# Patient Record
Sex: Female | Born: 1970 | Race: White | Hispanic: No | Marital: Married
Health system: Southern US, Community
[De-identification: ages and names within clinical notes are randomized; demographics above are authoritative.]

---

## 1998-03-15 ENCOUNTER — Other Ambulatory Visit: Admission: RE | Admit: 1998-03-15 | Discharge: 1998-03-15 | Payer: Self-pay | Admitting: Obstetrics and Gynecology

## 1999-09-14 ENCOUNTER — Inpatient Hospital Stay (HOSPITAL_COMMUNITY): Admission: AD | Admit: 1999-09-14 | Discharge: 1999-09-17 | Payer: Self-pay | Admitting: Obstetrics and Gynecology

## 1999-10-16 ENCOUNTER — Other Ambulatory Visit: Admission: RE | Admit: 1999-10-16 | Discharge: 1999-10-16 | Payer: Self-pay | Admitting: Obstetrics and Gynecology

## 2000-10-25 ENCOUNTER — Other Ambulatory Visit: Admission: RE | Admit: 2000-10-25 | Discharge: 2000-10-25 | Payer: Self-pay | Admitting: Obstetrics and Gynecology

## 2002-02-16 ENCOUNTER — Other Ambulatory Visit: Admission: RE | Admit: 2002-02-16 | Discharge: 2002-02-16 | Payer: Self-pay | Admitting: Obstetrics and Gynecology

## 2002-09-08 ENCOUNTER — Inpatient Hospital Stay (HOSPITAL_COMMUNITY): Admission: AD | Admit: 2002-09-08 | Discharge: 2002-09-11 | Payer: Self-pay | Admitting: Obstetrics and Gynecology

## 2002-10-30 ENCOUNTER — Other Ambulatory Visit: Admission: RE | Admit: 2002-10-30 | Discharge: 2002-10-30 | Payer: Self-pay | Admitting: Obstetrics and Gynecology

## 2004-07-26 ENCOUNTER — Other Ambulatory Visit: Admission: RE | Admit: 2004-07-26 | Discharge: 2004-07-26 | Payer: Self-pay | Admitting: Obstetrics and Gynecology

## 2005-10-08 ENCOUNTER — Other Ambulatory Visit: Admission: RE | Admit: 2005-10-08 | Discharge: 2005-10-08 | Payer: Self-pay | Admitting: Obstetrics and Gynecology

## 2008-02-27 ENCOUNTER — Encounter: Admission: RE | Admit: 2008-02-27 | Discharge: 2008-02-27 | Payer: Self-pay | Admitting: Obstetrics and Gynecology

## 2008-08-30 ENCOUNTER — Encounter: Admission: RE | Admit: 2008-08-30 | Discharge: 2008-08-30 | Payer: Self-pay | Admitting: Obstetrics and Gynecology

## 2009-07-18 ENCOUNTER — Encounter: Admission: RE | Admit: 2009-07-18 | Discharge: 2009-07-18 | Payer: Self-pay | Admitting: Obstetrics and Gynecology

## 2010-11-20 IMAGING — MG MM DIAGNOSTIC BILATERAL
9 series · 9 of 9 positions shown · non-contrast
Comparison: [DATE] [DATE], [DATE], [DATE] [DATE], [DATE], [DATE] [DATE], [DATE]

CLINICAL DATA: 6-month follow-up microcalcifications left breast

DIGITAL DIAGNOSTIC BILATERAL MAMMOGRAM WITH CAD

[R CC]
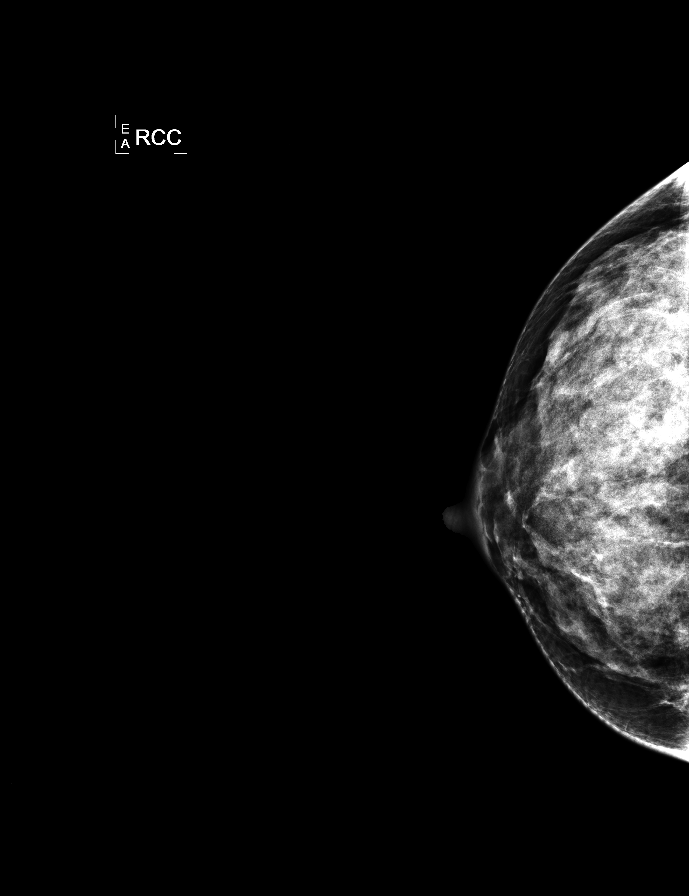

[L CC (1 of 3)]
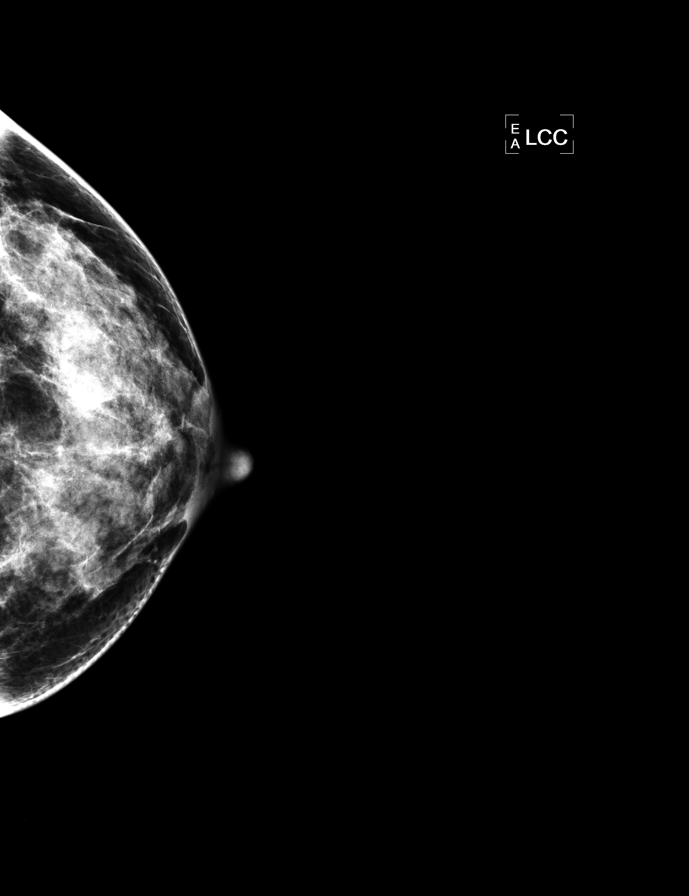

[L MLO (1 of 2)]
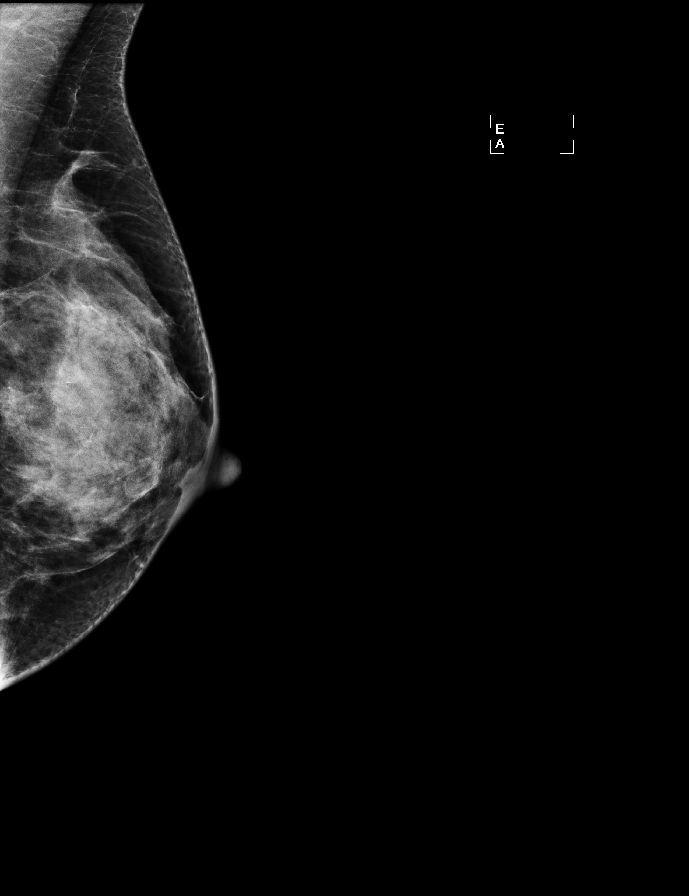

[R MLO]
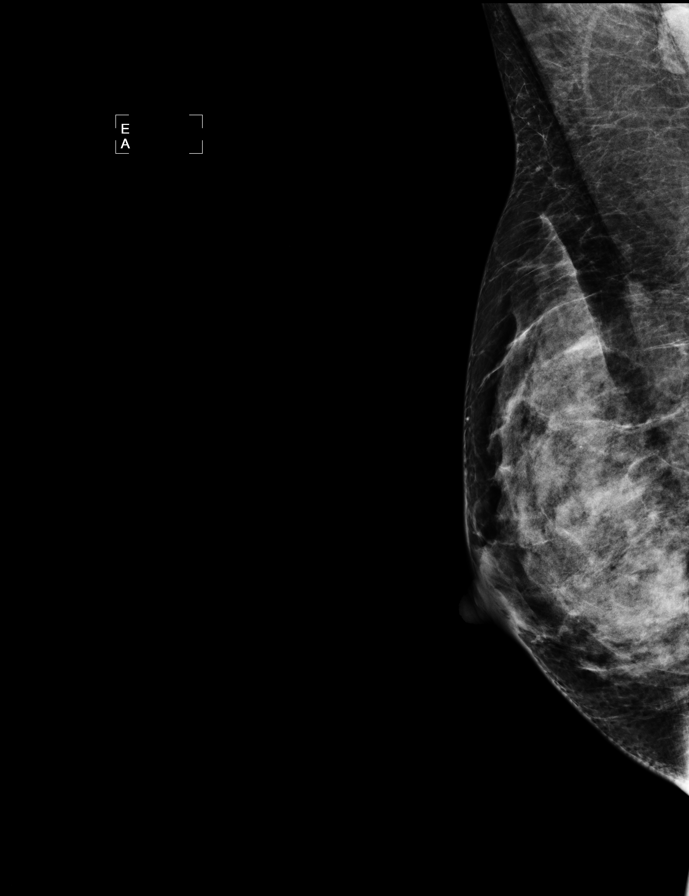

[L MLO (2 of 2)]
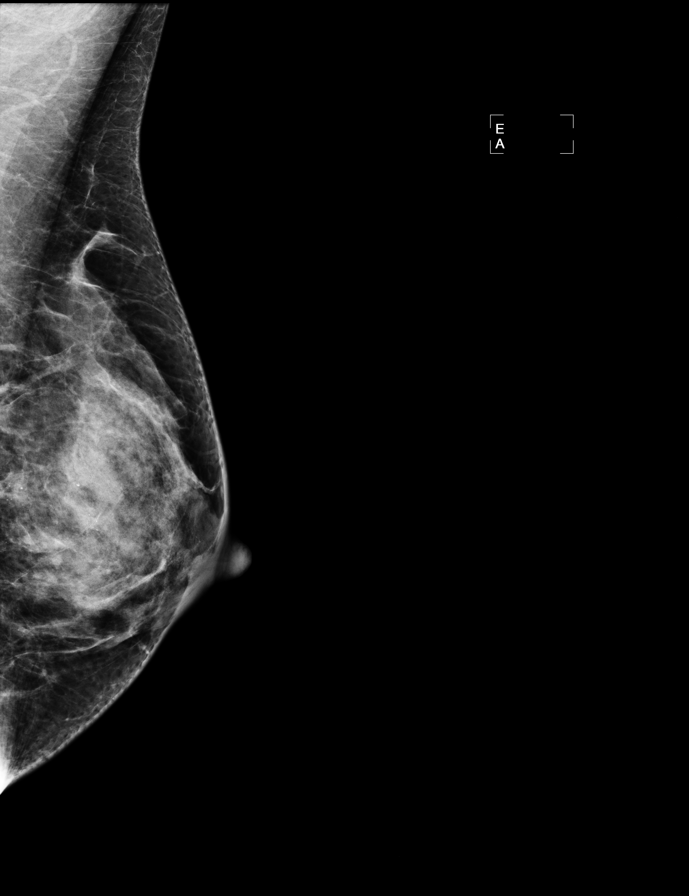

[L CC (2 of 3)]
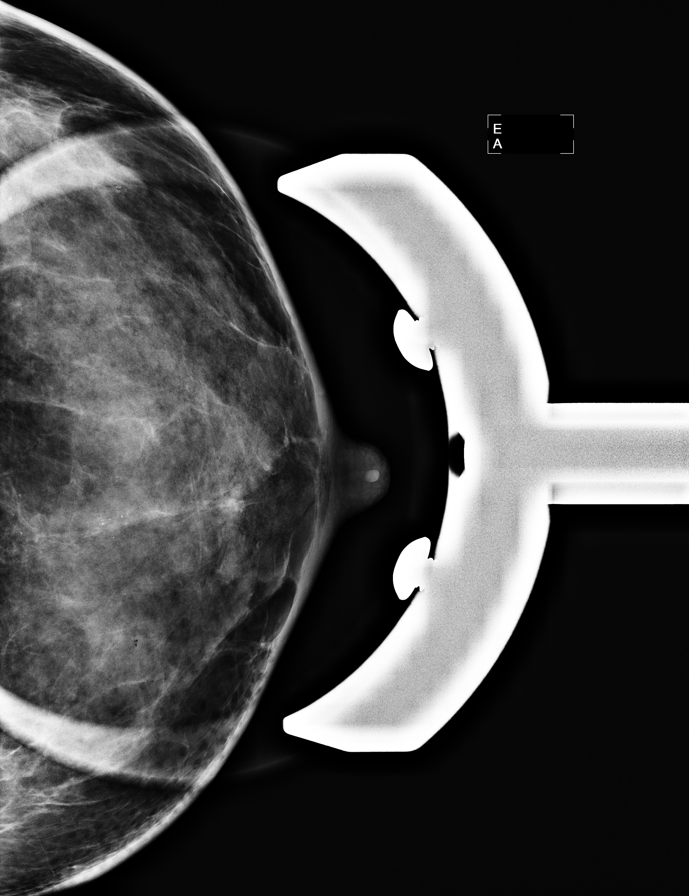

[L CC (3 of 3)]
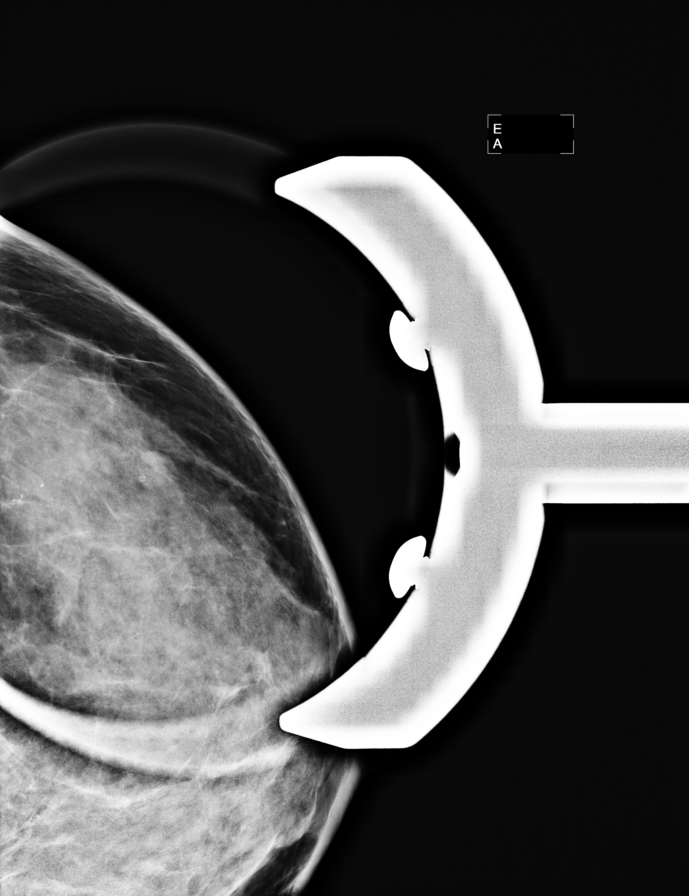

[L ML (1 of 2)]
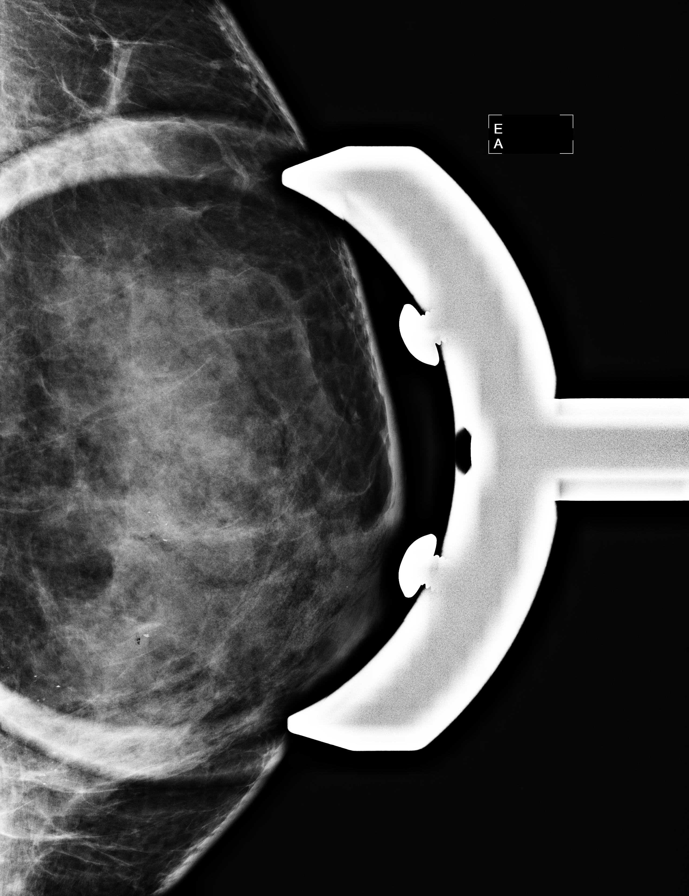

[L ML (2 of 2)]
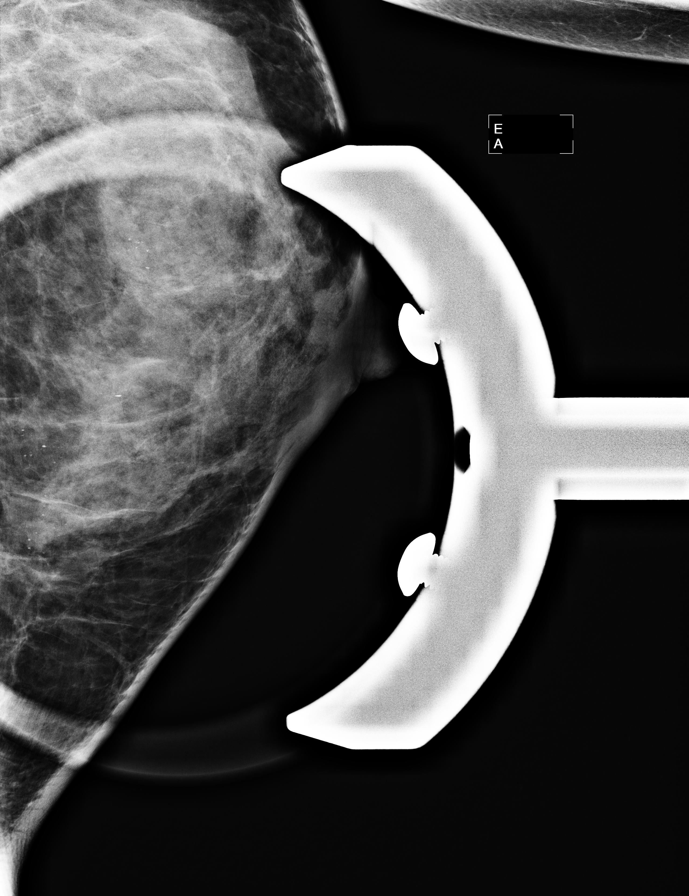

[9 of 9 positions shown; findings below may reference images not displayed]

FINDINGS: CC and MLO views of bilateral breasts, spot
magnification CC and lateral view left breast are submitted.
Breast parenchyma parenchyma is dense lowering the sensitivity of
mammography.  Stable left breast calcifications are identified.  No
suspicious abnormality is identified in both breasts.
Mammographic images were processed with CAD.
IMPRESSION: Benign findings, recommend routine screening mammogram in 1 year

BI-RADS CATEGORY 2:  Benign finding(s).

## 2020-01-23 ENCOUNTER — Ambulatory Visit: Payer: Self-pay | Attending: Internal Medicine

## 2020-01-23 DIAGNOSIS — Z23 Encounter for immunization: Secondary | ICD-10-CM | POA: Insufficient documentation

## 2020-01-23 NOTE — Progress Notes (Signed)
   Covid-19 Vaccination Clinic  Name:  Brooke Arellano    MRN: [id number] DOB: 04/21/1971  01/23/2020  Ms. [name] was observed post Covid-19 immunization for 15 minutes without incident. She was provided with Vaccine Information Sheet and instruction to access the V-Safe system.   Ms. [name] was instructed to call 911 with any severe reactions post vaccine: Marland Kitchen Difficulty breathing  . Swelling of face and throat  . A fast heartbeat  . A bad rash all over body  . Dizziness and weakness   Immunizations Administered    Name Date Dose VIS Date Route   Pfizer COVID-19 Vaccine 01/23/2020  7:14 PM 0.3 mL 10/30/2019 Intramuscular   Manufacturer: [employer], [employer]   Lot: [id number]   NDC: [address]-2

## 2020-02-13 ENCOUNTER — Ambulatory Visit: Payer: Self-pay | Attending: Internal Medicine

## 2020-02-13 DIAGNOSIS — Z23 Encounter for immunization: Secondary | ICD-10-CM

## 2020-02-13 NOTE — Progress Notes (Signed)
   Covid-19 Vaccination Clinic  Name:  Brooke Arellano    MRN: [id number] DOB: 1971-02-03  02/13/2020  Brooke Arellano was observed post Covid-19 immunization for 15 minutes without incident. She was provided with Vaccine Information Sheet and instruction to access the V-Safe system.   Brooke Arellano was instructed to call 911 with any severe reactions post vaccine: Marland Kitchen Difficulty breathing  . Swelling of face and throat  . A fast heartbeat  . A bad rash all over body  . Dizziness and weakness   Immunizations Administered    Name Date Dose VIS Date Route   Pfizer COVID-19 Vaccine 02/13/2020  1:23 PM 0.3 mL 10/30/2019 Intramuscular   Manufacturer: [employer], [employer]   Lot: [id number]   NDC: [address]-8

## 2020-02-23 ENCOUNTER — Ambulatory Visit: Payer: Self-pay

## 2020-03-01 DIAGNOSIS — Z681 Body mass index (BMI) 19 or less, adult: Secondary | ICD-10-CM | POA: Diagnosis not present

## 2020-03-01 DIAGNOSIS — Z01419 Encounter for gynecological examination (general) (routine) without abnormal findings: Secondary | ICD-10-CM | POA: Diagnosis not present

## 2020-03-31 DIAGNOSIS — Z1231 Encounter for screening mammogram for malignant neoplasm of breast: Secondary | ICD-10-CM | POA: Diagnosis not present

## 2021-01-30 DIAGNOSIS — Z0000 Encounter for general adult medical examination without abnormal findings: Secondary | ICD-10-CM | POA: Diagnosis not present

## 2021-01-30 DIAGNOSIS — Z13220 Encounter for screening for lipoid disorders: Secondary | ICD-10-CM | POA: Diagnosis not present

## 2021-01-30 DIAGNOSIS — Z23 Encounter for immunization: Secondary | ICD-10-CM | POA: Diagnosis not present

## 2021-01-30 DIAGNOSIS — E559 Vitamin D deficiency, unspecified: Secondary | ICD-10-CM | POA: Diagnosis not present

## 2021-01-30 DIAGNOSIS — Z1159 Encounter for screening for other viral diseases: Secondary | ICD-10-CM | POA: Diagnosis not present

## 2021-03-23 DIAGNOSIS — Z1211 Encounter for screening for malignant neoplasm of colon: Secondary | ICD-10-CM | POA: Diagnosis not present

## 2021-05-17 DIAGNOSIS — Z01419 Encounter for gynecological examination (general) (routine) without abnormal findings: Secondary | ICD-10-CM | POA: Diagnosis not present

## 2021-05-17 DIAGNOSIS — Z1231 Encounter for screening mammogram for malignant neoplasm of breast: Secondary | ICD-10-CM | POA: Diagnosis not present

## 2021-05-17 DIAGNOSIS — Z6820 Body mass index (BMI) 20.0-20.9, adult: Secondary | ICD-10-CM | POA: Diagnosis not present

## 2021-07-26 DIAGNOSIS — D125 Benign neoplasm of sigmoid colon: Secondary | ICD-10-CM | POA: Diagnosis not present

## 2021-07-26 DIAGNOSIS — K635 Polyp of colon: Secondary | ICD-10-CM | POA: Diagnosis not present

## 2021-07-26 DIAGNOSIS — Z1211 Encounter for screening for malignant neoplasm of colon: Secondary | ICD-10-CM | POA: Diagnosis not present

## 2021-07-26 DIAGNOSIS — K5730 Diverticulosis of large intestine without perforation or abscess without bleeding: Secondary | ICD-10-CM | POA: Diagnosis not present

## 2021-10-05 DIAGNOSIS — H52203 Unspecified astigmatism, bilateral: Secondary | ICD-10-CM | POA: Diagnosis not present

## 2021-10-05 DIAGNOSIS — H524 Presbyopia: Secondary | ICD-10-CM | POA: Diagnosis not present

## 2021-10-05 DIAGNOSIS — H04123 Dry eye syndrome of bilateral lacrimal glands: Secondary | ICD-10-CM | POA: Diagnosis not present

## 2022-05-28 DIAGNOSIS — Z0000 Encounter for general adult medical examination without abnormal findings: Secondary | ICD-10-CM | POA: Diagnosis not present

## 2022-08-08 DIAGNOSIS — Z01419 Encounter for gynecological examination (general) (routine) without abnormal findings: Secondary | ICD-10-CM | POA: Diagnosis not present

## 2022-08-08 DIAGNOSIS — Z1231 Encounter for screening mammogram for malignant neoplasm of breast: Secondary | ICD-10-CM | POA: Diagnosis not present

## 2022-08-08 DIAGNOSIS — Z681 Body mass index (BMI) 19 or less, adult: Secondary | ICD-10-CM | POA: Diagnosis not present

## 2024-02-11 DIAGNOSIS — L821 Other seborrheic keratosis: Secondary | ICD-10-CM | POA: Diagnosis not present

## 2024-02-11 DIAGNOSIS — D229 Melanocytic nevi, unspecified: Secondary | ICD-10-CM | POA: Diagnosis not present

## 2024-02-11 DIAGNOSIS — L814 Other melanin hyperpigmentation: Secondary | ICD-10-CM | POA: Diagnosis not present

## 2024-02-11 DIAGNOSIS — L578 Other skin changes due to chronic exposure to nonionizing radiation: Secondary | ICD-10-CM | POA: Diagnosis not present

## 2024-06-26 DIAGNOSIS — E559 Vitamin D deficiency, unspecified: Secondary | ICD-10-CM | POA: Diagnosis not present

## 2024-06-26 DIAGNOSIS — E785 Hyperlipidemia, unspecified: Secondary | ICD-10-CM | POA: Diagnosis not present

## 2024-06-26 DIAGNOSIS — Z0000 Encounter for general adult medical examination without abnormal findings: Secondary | ICD-10-CM | POA: Diagnosis not present

## 2024-08-19 DIAGNOSIS — Z1231 Encounter for screening mammogram for malignant neoplasm of breast: Secondary | ICD-10-CM | POA: Diagnosis not present

## 2024-08-19 DIAGNOSIS — Z01419 Encounter for gynecological examination (general) (routine) without abnormal findings: Secondary | ICD-10-CM | POA: Diagnosis not present

## 2024-08-19 DIAGNOSIS — Z681 Body mass index (BMI) 19 or less, adult: Secondary | ICD-10-CM | POA: Diagnosis not present

## 2024-09-30 DIAGNOSIS — Z23 Encounter for immunization: Secondary | ICD-10-CM | POA: Diagnosis not present

## 2024-09-30 DIAGNOSIS — E785 Hyperlipidemia, unspecified: Secondary | ICD-10-CM | POA: Diagnosis not present
